# Patient Record
Sex: Female | Born: 1950 | Hispanic: Yes | Marital: Single | Smoking: Never smoker
Health system: Southern US, Community
[De-identification: ages and names within clinical notes are randomized; demographics above are authoritative.]

## PROBLEM LIST (undated history)

## (undated) DIAGNOSIS — I10 Essential (primary) hypertension: Secondary | ICD-10-CM

## (undated) DIAGNOSIS — M1990 Unspecified osteoarthritis, unspecified site: Secondary | ICD-10-CM

---

## 2018-12-28 ENCOUNTER — Encounter (HOSPITAL_COMMUNITY): Payer: Self-pay | Admitting: Emergency Medicine

## 2018-12-28 ENCOUNTER — Emergency Department (HOSPITAL_COMMUNITY)
Admission: EM | Admit: 2018-12-28 | Discharge: 2018-12-28 | Disposition: A | Discharge status: Home / Self Care | Payer: Self-pay | Attending: Emergency Medicine | Admitting: Emergency Medicine

## 2018-12-28 ENCOUNTER — Other Ambulatory Visit: Payer: Self-pay

## 2018-12-28 ENCOUNTER — Emergency Department (HOSPITAL_COMMUNITY): Payer: Self-pay

## 2018-12-28 DIAGNOSIS — R030 Elevated blood-pressure reading, without diagnosis of hypertension: Secondary | ICD-10-CM

## 2018-12-28 DIAGNOSIS — I10 Essential (primary) hypertension: Secondary | ICD-10-CM | POA: Insufficient documentation

## 2018-12-28 HISTORY — DX: Essential (primary) hypertension: I10

## 2018-12-28 LAB — BASIC METABOLIC PANEL
Anion gap: 12 (ref 5–15)
BUN: 14 mg/dL (ref 8–23)
CO2: 23 mmol/L (ref 22–32)
Calcium: 9.6 mg/dL (ref 8.9–10.3)
Chloride: 103 mmol/L (ref 98–111)
Creatinine, Ser: 0.84 mg/dL (ref 0.44–1.00)
GFR calc Af Amer: >60 mL/min (ref >60)
GFR calc non Af Amer: >60 mL/min (ref >60)
Glucose, Bld: 104 mg/dL — ABNORMAL HIGH (ref 70–99)
Potassium: 5 mmol/L (ref 3.5–5.1)
Sodium: 138 mmol/L (ref 135–145)

## 2018-12-28 LAB — CBC WITH DIFFERENTIAL/PLATELET
Abs Immature Granulocytes: 0.01 10*3/uL (ref 0.00–0.07)
Basophils Absolute: 0 10*3/uL (ref 0.0–0.1)
Basophils Relative: 0 %
Eosinophils Absolute: 0.1 10*3/uL (ref 0.0–0.5)
Eosinophils Relative: 2 %
HCT: 44.3 % (ref 36.0–46.0)
Hemoglobin: 14.7 g/dL (ref 12.0–15.0)
Immature Granulocytes: 0 %
Lymphocytes Relative: 45 %
Lymphs Abs: 2.8 10*3/uL (ref 0.7–4.0)
MCH: 28.5 pg (ref 26.0–34.0)
MCHC: 33.2 g/dL (ref 30.0–36.0)
MCV: 85.9 fL (ref 80.0–100.0)
Monocytes Absolute: 0.3 10*3/uL (ref 0.1–1.0)
Monocytes Relative: 5 %
Neutro Abs: 3 10*3/uL (ref 1.7–7.7)
Neutrophils Relative %: 48 %
Platelets: 271 10*3/uL (ref 150–400)
RBC: 5.16 MIL/uL — ABNORMAL HIGH (ref 3.87–5.11)
RDW: 13.8 % (ref 11.5–15.5)
WBC: 6.2 10*3/uL (ref 4.0–10.5)
nRBC: 0 % (ref 0.0–0.2)

## 2018-12-28 MED ORDER — IRBESARTAN 150 MG PO TABS
150.0000 mg | ORAL_TABLET | Freq: Once | ORAL | Status: AC
  Administered 2018-12-28: 13:00:00 150 mg via ORAL
  Filled 2018-12-28 (×2): qty 1

## 2018-12-28 MED ORDER — IRBESARTAN 150 MG PO TABS: 150.0000 mg | ORAL_TABLET | Freq: Every day | ORAL | 0 refills | Status: DC

## 2018-12-28 NOTE — ED Provider Notes (Signed)
[facility] Provider Note   CSN: [id number] Arrival date & time: 12/28/18  1058     History   Chief Complaint Chief Complaint  Patient presents with  . Hypertension    HPI Sherry Bailey is a [age] y.o. female with past medical history of hypertension, prediabetes presents to the emergency department today with chief complaint of high blood pressure. This started x 3 days ago after running out of her hypertension medication. She is here visiting from [place].  She was only planning to stay for short time however because of the pandemic she has been here for 5 months and unable to get a flight home.  She reports her blood pressure was well controlled while taking medication.  Since running out she has had multiple readings of systolics in the 200s.  She said she developed a headache 2 days ago.  Located in the left side of her head and mild.  She did not take any medications for the headache.  She went to urgent care today trying to get a refill on her blood pressure medication however because her systolic was again in the 200 she was sent to the emergency department for further evaluation.  She currently she is asymptomatic. She denies fever, chills, cough, chest pain, shortness of breath, headache, visual changes, neck pain, abdominal pain, nausea, vomiting, diaphoresis, back pain, urinary symptoms, syncope, dizziness, palpitations, lower extremity edema.  Past Medical History:  Diagnosis Date  . Hypertension     There are no active problems to display for this patient.    OB History   No obstetric history on file.      Home Medications    Prior to Admission medications   Medication Sig Start Date End Date Taking? Authorizing Provider  irbesartan (AVAPRO) 150 MG tablet Take 1 tablet (150 mg total) by mouth daily. 12/28/18 02/26/19  Albrizze, Caroleen HammanKaitlyn E, PA-C    Family History No family history on file.  Social History Social History   Tobacco Use  . Smoking status: Not on file  Substance Use Topics  . Alcohol use: Not on file  . Drug use: Not on file     Allergies   Aspirin   Review of Systems Review of Systems  Constitutional: Negative for chills and fever.  HENT: Negative for congestion, ear discharge, ear pain, sinus pressure, sinus pain and sore throat.   Eyes: Negative for pain and redness.  Respiratory: Negative for cough and shortness of breath.   Cardiovascular: Negative for chest pain, palpitations and leg swelling.  Gastrointestinal: Negative for abdominal pain, constipation, diarrhea, nausea and vomiting.  Genitourinary: Negative for dysuria and hematuria.  Musculoskeletal: Negative for back pain and neck pain.  Skin: Negative for wound.  Neurological: Positive for headaches. Negative for weakness and numbness.     Physical Exam Updated Vital Signs BP (!) 172/74   Pulse (!) 55   Temp 98.4 F (36.9 C) (Oral)   Resp 20   Ht 5\' 1"  (1.549 m)   Wt 81.2 kg   SpO2 96%   BMI 33.82 kg/m   Physical Exam Vitals signs and nursing note reviewed.  Constitutional:      General: She is not in acute distress.    Appearance: She is not ill-appearing.  HENT:     Head: Normocephalic and atraumatic.     Right Ear: Tympanic membrane and external ear normal.     Left Ear: Tympanic membrane and external ear normal.  Nose: Nose normal.     Mouth/Throat:     Mouth: Mucous membranes are moist.     Pharynx: Oropharynx is clear.  Eyes:     General: No scleral icterus.       Right eye: No discharge.        Left eye: No discharge.     Extraocular Movements: Extraocular movements intact.     Conjunctiva/sclera: Conjunctivae normal.     Pupils: Pupils are equal, round, and reactive to light.  Neck:     Musculoskeletal: Normal range of motion.     Vascular: No JVD.  Cardiovascular:     Rate and Rhythm: Normal rate and regular rhythm.     Pulses: Normal pulses.          Radial pulses are 2+ on the  right side and 2+ on the left side.     Heart sounds: Normal heart sounds.  Pulmonary:     Comments: Lungs clear to auscultation in all fields. Symmetric chest rise. No wheezing, rales, or rhonchi. Abdominal:     Comments: Abdomen is soft, non-distended, and non-tender in all quadrants. No rigidity, no guarding. No peritoneal signs.  Musculoskeletal: Normal range of motion.  Skin:    General: Skin is warm and dry.     Capillary Refill: Capillary refill takes less than 2 seconds.  Neurological:     Mental Status: She is oriented to person, place, and time.     GCS: GCS eye subscore is 4. GCS verbal subscore is 5. GCS motor subscore is 6.     Comments: Mental Status:  Alert, oriented, thought content appropriate, able to give a coherent history. Speech fluent without evidence of aphasia. Able to follow 2 step commands without difficulty.  Cranial Nerves:  II:  Peripheral visual fields grossly normal, pupils equal, round, reactive to light III,IV, VI: ptosis not present, extra-ocular motions intact bilaterally  V,VII: smile symmetric, facial light touch sensation equal VIII: hearing grossly normal to voice  X: uvula elevates symmetrically  XI: bilateral shoulder shrug symmetric and strong XII: midline tongue extension without fassiculations Motor:  Normal tone. 5/5 in upper and lower extremities bilaterally including strong and equal grip strength and dorsiflexion/plantar flexion Sensory: Pinprick and light touch normal in all extremities.  Deep Tendon Reflexes: 2+ and symmetric in the biceps and patella Cerebellar: normal finger-to-nose with bilateral upper extremities Gait: normal gait and balance CV: distal pulses palpable throughout   Psychiatric:        Behavior: Behavior normal.      ED Treatments / Results  Labs (all labs ordered are listed, but only abnormal results are displayed) Labs Reviewed  BASIC METABOLIC PANEL - Abnormal; Notable for the following components:       Result Value   Glucose, Bld 104 (*)    All other components within normal limits  CBC WITH DIFFERENTIAL/PLATELET - Abnormal; Notable for the following components:   RBC 5.16 (*)    All other components within normal limits    EKG EKG Interpretation  Date/Time:  Sunday December 28 2018 11:18:25 EDT Ventricular Rate:  73 PR Interval:    QRS Duration: 95 QT Interval:  414 QTC Calculation: 457 R Axis:   -59 Text Interpretation:  Sinus rhythm Left anterior fascicular block Abnormal R-wave progression, late transition Probable left ventricular hypertrophy Nonspecific T abnormalities, lateral leads No prior ECG for comparison.  No STEMI Confirmed by Theda Belfast ([id number]) on 12/28/2018 11:48:14 AM   Radiology Dg Chest 2 View  Result Date: 12/28/2018 CLINICAL DATA:  Hypertension. EXAM: CHEST - 2 VIEW COMPARISON:  None. FINDINGS: Borderline cardiomegaly. Normal mediastinal contours. Normal pulmonary vascularity. Minimal atelectasis in the right middle lobe. No focal consolidation, pleural effusion, or pneumothorax. No acute osseous abnormality. IMPRESSION: No active cardiopulmonary disease. Electronically Signed   By: Titus Dubin M.D.   On: 12/28/2018 12:54    Procedures Procedures (including critical care time)  Medications Ordered in ED Medications  irbesartan (AVAPRO) tablet 150 mg (150 mg Oral Given 12/28/18 1308)     Initial Impression / Assessment and Plan / ED Course  I have reviewed the triage vital signs and the nursing notes.  Pertinent labs & imaging results that were available during my care of the patient were reviewed by me and considered in my medical decision making (see chart for details).  Patient seen and evaluated.  On arrival she is hypertensive to 207/91. She is asymptomatic at this time. Her headache has resolved without intervention. Lungs are clear to auscultation in all fields, neuro exam without focal deficit. EKG without stemi, no prior to compare. CBC and  BMP are unremarkable. Chest xray viewed by me shows borderline cardiomegaly. Discussed xray findings with pt and she reports being aware of "having a big heart" and that her doctors at home are monitoring her. Engaged in shared decision making with pt regarding head CT as hypertension and headache are concerning for CVA. Pt declines CT at this time. Given her normal neuro exam I feel this is reasonable. Pt given home dose of irbesartan. Blood pressure improved.  The patient appears reasonably screened and/or stabilized for discharge and I doubt any other medical condition or other [facility] requiring further screening, evaluation, or treatment in the ED at this time prior to discharge. The patient is safe for discharge with strict return precautions discussed. Recommend pcp follow up. Pt given 2 month prescription for irbesartan as she is not sure when she will be able to fly home to [place]. Findings and plan of care discussed with supervising physician Dr. Sherry Ruffing.  This note was prepared using Dragon voice recognition software and may include unintentional dictation errors due to the inherent limitations of voice recognition software.    Final Clinical Impressions(s) / ED Diagnoses   Final diagnoses:  Elevated blood pressure reading    ED Discharge Orders         Ordered    irbesartan (AVAPRO) 150 MG tablet  Daily     12/28/18 1325           Flint Melter 12/28/18 2006    Tegeler, Gwenyth Allegra, MD 12/29/18 2011

## 2018-12-28 NOTE — ED Notes (Signed)
Irbesartan will be coming from main pharmacy, will admin when available

## 2018-12-28 NOTE — ED Notes (Signed)
Called pharmacy about avapro not available still, pharm will reprint label and send

## 2018-12-28 NOTE — Discharge Instructions (Addendum)
You have been seen today for high blood pressure. Please read and follow all provided instructions. Return to the emergency room for worsening condition or new concerning symptoms.     1. Medications:  Prescription printed for Irbesartan (avapro) 150mg . This is your blood pressure medication. Please take 1 pill every day. The prescription is for  2 months of pills. Continue usual home medications  2. Treatment: rest, drink plenty of fluids  3. Follow Up: Please follow up with your primary doctor if needed  ?

## 2018-12-28 NOTE — ED Triage Notes (Signed)
Pt ran out of medication for HTN. Yesterday, SBP 224mmHg; rechecked this AM, SBP 132mmHg. Has has L temporal HA x 2 days.  Denies CP, SOB, vision changes, heart palpatations.  Denies pacemaker, stroke hx. Has been prediabetic in past. Reports BP that she ran out of is irbesartan 150mg , prescribedonce daily in AM

## 2018-12-28 NOTE — ED Notes (Signed)
Patient transported to X-ray 

## 2019-05-12 ENCOUNTER — Emergency Department (HOSPITAL_COMMUNITY)
Admission: EM | Admit: 2019-05-12 | Discharge: 2019-05-12 | Disposition: A | Discharge status: Home / Self Care | Payer: Self-pay | Attending: Emergency Medicine | Admitting: Emergency Medicine

## 2019-05-12 ENCOUNTER — Encounter (HOSPITAL_COMMUNITY): Payer: Self-pay | Admitting: Emergency Medicine

## 2019-05-12 ENCOUNTER — Other Ambulatory Visit: Payer: Self-pay

## 2019-05-12 DIAGNOSIS — Z760 Encounter for issue of repeat prescription: Secondary | ICD-10-CM | POA: Insufficient documentation

## 2019-05-12 DIAGNOSIS — I10 Essential (primary) hypertension: Secondary | ICD-10-CM | POA: Insufficient documentation

## 2019-05-12 MED ORDER — IRBESARTAN 150 MG PO TABS: 150.0000 mg | ORAL_TABLET | Freq: Every day | ORAL | 0 refills | Status: AC

## 2019-05-12 NOTE — Care Management (Signed)
ED CM noted patient to be without insurance discussed Goodrx, and discount for the uninsured. CM provided patient with instructions and  Goodrx card.  No further ED CM needs identified.

## 2019-05-12 NOTE — ED Triage Notes (Signed)
Patient ran out of her antihypertensive medication yesterday , requesting refill/prescription . Denies pain /respirations unlabored .

## 2019-05-12 NOTE — ED Provider Notes (Signed)
[facility] EMERGENCY DEPARTMENT Provider Note   CSN: [id number] Arrival date & time: 05/12/19  1919     History Chief Complaint  Patient presents with  . Medication Refill    Sherry Bailey is a 69 y.o. female.  Patient presents to the emergency department with a chief complaint of needing her blood pressure medicine refilled.  She states she does not have a doctor because she is from [place].  She is going back there in the next 2 to 3 months.  She is requesting a refill of Irbesartan.  She denies having any symptoms.  She states that she had nowhere else to go to get the medication refilled, so she came to the ER.  The history is provided by the patient. A language interpreter was used ([place]).       Past Medical History:  Diagnosis Date  . Hypertension     There are no problems to display for this patient.   History reviewed. No pertinent surgical history.   OB History   No obstetric history on file.     No family history on file.  Social History   Tobacco Use  . Smoking status: Never Smoker  . Smokeless tobacco: Never Used  Substance Use Topics  . Alcohol use: Never  . Drug use: Never    Home Medications Prior to Admission medications   Medication Sig Start Date End Date Taking? Authorizing Provider  irbesartan (AVAPRO) 150 MG tablet Take 1 tablet (150 mg total) by mouth daily. 05/12/19 08/10/19  Montine Circle, PA-C    Allergies    Aspirin  Review of Systems   Review of Systems  All other systems reviewed and are negative.   Physical Exam Updated Vital Signs BP (!) 193/98 (BP Location: Right Arm)   Pulse 79   Temp 98.8 F (37.1 C) (Oral)   Resp 17   SpO2 98%   Physical Exam Vitals and nursing note reviewed.  Constitutional:      General: She is not in acute distress.    Appearance: She is well-developed.  HENT:     Head: Normocephalic and atraumatic.  Eyes:     Conjunctiva/sclera: Conjunctivae normal.    Cardiovascular:     Rate and Rhythm: Normal rate.     Heart sounds: No murmur.  Pulmonary:     Effort: Pulmonary effort is normal. No respiratory distress.  Abdominal:     General: There is no distension.  Musculoskeletal:     Cervical back: Neck supple.     Comments: Moves all extremities  Skin:    General: Skin is warm and dry.  Neurological:     Mental Status: She is alert and oriented to person, place, and time.  Psychiatric:        Mood and Affect: Mood normal.        Behavior: Behavior normal.     ED Results / Procedures / Treatments   Labs (all labs ordered are listed, but only abnormal results are displayed) Labs Reviewed - No data to display  EKG None  Radiology No results found.  Procedures Procedures (including critical care time)  Medications Ordered in ED Medications - No data to display  ED Course  I have reviewed the triage vital signs and the nursing notes.  Pertinent labs & imaging results that were available during my care of the patient were reviewed by me and considered in my medical decision making (see chart for details).    MDM Rules/Calculators/A&P  Blood pressure medication refill.  Asymptomatic.  No other complaints.   Final Clinical Impression(s) / ED Diagnoses Final diagnoses:  Medication refill    Rx / DC Orders ED Discharge Orders         Ordered    irbesartan (AVAPRO) 150 MG tablet  Daily     05/12/19 2216           Roxy Horseman, PA-C 05/12/19 2218    Tegeler, Canary Brim, MD 05/12/19 2352

## 2019-06-14 ENCOUNTER — Other Ambulatory Visit: Payer: Self-pay

## 2019-06-14 ENCOUNTER — Emergency Department (HOSPITAL_COMMUNITY): Payer: Self-pay

## 2019-06-14 ENCOUNTER — Emergency Department (HOSPITAL_COMMUNITY)
Admission: EM | Admit: 2019-06-14 | Discharge: 2019-06-14 | Disposition: A | Discharge status: Home / Self Care | Payer: Self-pay | Attending: Emergency Medicine | Admitting: Emergency Medicine

## 2019-06-14 ENCOUNTER — Encounter (HOSPITAL_COMMUNITY): Payer: Self-pay | Admitting: Emergency Medicine

## 2019-06-14 DIAGNOSIS — M25551 Pain in right hip: Secondary | ICD-10-CM | POA: Insufficient documentation

## 2019-06-14 DIAGNOSIS — W010XXD Fall on same level from slipping, tripping and stumbling without subsequent striking against object, subsequent encounter: Secondary | ICD-10-CM | POA: Insufficient documentation

## 2019-06-14 DIAGNOSIS — I10 Essential (primary) hypertension: Secondary | ICD-10-CM | POA: Insufficient documentation

## 2019-06-14 DIAGNOSIS — M5441 Lumbago with sciatica, right side: Secondary | ICD-10-CM | POA: Insufficient documentation

## 2019-06-14 DIAGNOSIS — M25552 Pain in left hip: Secondary | ICD-10-CM | POA: Insufficient documentation

## 2019-06-14 DIAGNOSIS — S92355A Nondisplaced fracture of fifth metatarsal bone, left foot, initial encounter for closed fracture: Secondary | ICD-10-CM

## 2019-06-14 DIAGNOSIS — S92355D Nondisplaced fracture of fifth metatarsal bone, left foot, subsequent encounter for fracture with routine healing: Secondary | ICD-10-CM | POA: Insufficient documentation

## 2019-06-14 DIAGNOSIS — Z79899 Other long term (current) drug therapy: Secondary | ICD-10-CM | POA: Insufficient documentation

## 2019-06-14 DIAGNOSIS — M25559 Pain in unspecified hip: Secondary | ICD-10-CM

## 2019-06-14 HISTORY — DX: Unspecified osteoarthritis, unspecified site: M19.90

## 2019-06-14 MED ORDER — HYDROCODONE-ACETAMINOPHEN 5-325 MG PO TABS
1.0000 | ORAL_TABLET | Freq: Once | ORAL | Status: AC
  Administered 2019-06-14: 1 via ORAL
  Filled 2019-06-14: qty 1

## 2019-06-14 MED ORDER — TIZANIDINE HCL 2 MG PO CAPS: 2.0000 mg | ORAL_CAPSULE | Freq: Three times a day (TID) | ORAL | 0 refills | Status: AC

## 2019-06-14 MED ORDER — PREDNISONE 20 MG PO TABS: 40.0000 mg | ORAL_TABLET | Freq: Every day | ORAL | 0 refills | Status: AC

## 2019-06-14 NOTE — ED Notes (Signed)
Pt walked independently to BR.

## 2019-06-14 NOTE — ED Provider Notes (Signed)
[facility] EMERGENCY DEPARTMENT Provider Note   CSN: [id number] Arrival date & time: 06/14/19  1231     History Chief Complaint  Patient presents with  . Back Pain  . Ankle Pain  . Fall    Sherry Bailey is a 69 y.o. female who presents for evaluation of consistent back pain, ankle pain and bilateral hip pain that has been ongoing for last 2 months.  She reports that a 2 months ago, she slipped and twisted her left ankle.  She also reports that when she did this, she twisted her entire body.  She states that she was having some pain and swelling around the left ankle and states that she was using ice and ibuprofen out with symptoms.  About a week later, she started having some lower back pain and pain in her bilateral hips.  She reports that since that incident she is continued to have persistent pain in her back, hips.  She feels like at times, the area will get swollen and inflamed and she has to do ibuprofen and ice to help with the inflammation.  She has not had any new trauma, injury, fall.  She has not sought evaluation for this.  She reports that today was the first time.  No changes or worsening symptoms but seems to states that she continues to have issues and so she wanted to get it checked out.  Patient is concerned that she may have arthritis but has never been diagnosed with arthritis.  She has not taken any other medications.  She has been able to ambulate but does sometimes report worsening pain in her hips with ambulation.  Patient denies any fevers, numbness/weakness of arms or legs, difficulty ambulating, history of back surgery, urinary or bowel incontinence, saddle anesthesia.  The history is provided by the patient.       Past Medical History:  Diagnosis Date  . Arthritis   . Hypertension     There are no problems to display for this patient.   History reviewed. No pertinent surgical history.   OB History   No obstetric history on file.      No family history on file.  Social History   Tobacco Use  . Smoking status: Never Smoker  . Smokeless tobacco: Never Used  Substance Use Topics  . Alcohol use: Never  . Drug use: Never    Home Medications Prior to Admission medications   Medication Sig Start Date End Date Taking? Authorizing Provider  irbesartan (AVAPRO) 150 MG tablet Take 1 tablet (150 mg total) by mouth daily. 05/12/19 08/10/19  Roxy Horseman, PA-C  predniSONE (DELTASONE) 20 MG tablet Take 2 tablets (40 mg total) by mouth daily for 4 days. 06/14/19 06/18/19  Maxwell Caul, PA-C  tizanidine (ZANAFLEX) 2 MG capsule Take 1 capsule (2 mg total) by mouth 3 (three) times daily for 5 days. 06/14/19 06/19/19  Maxwell Caul, PA-C    Allergies    Aspirin  Review of Systems   Review of Systems  Constitutional: Negative for fever.  Musculoskeletal: Positive for back pain.       Left ankle pain Hip pain  Neurological: Negative for weakness and numbness.  All other systems reviewed and are negative.   Physical Exam Updated Vital Signs BP 135/88   Pulse 84   Temp 98.6 F (37 C) (Oral)   Resp 18   SpO2 98%   Physical Exam Vitals and nursing note reviewed.  Constitutional:  Appearance: She is well-developed.  HENT:     Head: Normocephalic and atraumatic.  Eyes:     General: No scleral icterus.       Right eye: No discharge.        Left eye: No discharge.     Conjunctiva/sclera: Conjunctivae normal.  Cardiovascular:     Pulses:          Dorsalis pedis pulses are 2+ on the right side and 2+ on the left side.  Pulmonary:     Effort: Pulmonary effort is normal.  Musculoskeletal:       Back:     Comments: No midline T-spine tenderness.  No deformity or crepitus noted.  Diffuse tenderness noted to the lower lumbar region that extends over the entire lower lumbar area including the midline.  No midline deformity or crepitus noted.  Mild tenderness palpation noted bilateral hips.  Full range of  hips without any difficulty.  No deformity or crepitus noted bilateral hips.  No bony tenderness noted to bilateral knees, bilateral tib-fib, right ankle.  Mild tenderness noted to lateral malleolus of left ankle.  No overlying soft tissue swelling.  Dorsiflexion plantarflexion intact without difficulty.  Skin:    General: Skin is warm and dry.     Comments: Good distal cap refill. LLE is not dusky in appearance or cool to touch.  Neurological:     Mental Status: She is alert.     Comments: Follows commands, Moves all extremities  5/5 strength to BUE and BLE  Sensation intact throughout all major nerve distributions Straight leg raise noted on the right lower extremity.  Psychiatric:        Speech: Speech normal.        Behavior: Behavior normal.     ED Results / Procedures / Treatments   Labs (all labs ordered are listed, but only abnormal results are displayed) Labs Reviewed - No data to display  EKG None  Radiology DG Lumbar Spine Complete  Result Date: 06/14/2019 CLINICAL DATA:  Status post fall 2 months ago with low back pain. EXAM: LUMBAR SPINE - COMPLETE 4+ VIEW COMPARISON:  None. FINDINGS: There is no acute fracture or dislocation. The alignment is normal. Degenerative joint changes with narrowed joint space and osteophyte formation are identified in the thoracolumbar junction and in the lower lumbar spine. IMPRESSION: No acute fracture or dislocation. Degenerative joint changes of spine. Electronically Signed   By: Abelardo Diesel M.D.   On: 06/14/2019 14:42   DG Ankle Complete Left  Result Date: 06/14/2019 CLINICAL DATA:  Status post fall 2 months ago with left ankle pain. EXAM: LEFT ANKLE COMPLETE - 3+ VIEW COMPARISON:  None. FINDINGS: There is deformity of the base of the fifth metatarsal with evidence of callus formation consistent with healing fracture. Plantar calcaneal spur is noted. Soft tissues are unremarkable. IMPRESSION: There is deformity of the base of the fifth  metatarsal with evidence of callus formation consistent with healing fracture. Electronically Signed   By: Abelardo Diesel M.D.   On: 06/14/2019 14:45   DG Hips Bilat W or Wo Pelvis 3-4 Views  Result Date: 06/14/2019 CLINICAL DATA:  Status post fall 2 months ago with hip pain. EXAM: DG HIP (WITH OR WITHOUT PELVIS) 3-4V BILAT COMPARISON:  None. FINDINGS: There is no evidence of hip fracture or dislocation. There is no evidence of arthropathy or other focal bone abnormality. IMPRESSION: Negative. Electronically Signed   By: Abelardo Diesel M.D.   On: 06/14/2019 14:43  Procedures Procedures (including critical care time)  Medications Ordered in ED Medications  HYDROcodone-acetaminophen (NORCO/VICODIN) 5-325 MG per tablet 1 tablet (1 tablet Oral Given 06/14/19 1348)    ED Course  I have reviewed the triage vital signs and the nursing notes.  Pertinent labs & imaging results that were available during my care of the patient were reviewed by me and considered in my medical decision making (see chart for details).    MDM Rules/Calculators/A&P                      69 year old female who presents for evaluation of persistent continued lower back pain, bilateral hip pain and left ankle pain.  She reports that symptoms initially began after she twisted about 2 months ago.  But she states that since then, she has continued to have pain.  She has been taking ibuprofen with minimal improvement.  No new trauma, injury.  No new changes in symptoms but just felt like this is been ongoing for so long that she need to get checked out.  Initially arrival, she is afebrile, nontoxic-appearing.  Vital signs are stable.  She is neurovascular intact.  No red flag symptoms.  No neuro deficits noted.  History/physical exam not concerning for cauda equina, spinal abscess.  History/physical exam not concerning for ischemic limb.  I suspect this is mostly musculoskeletal in nature versus arthritis.  I do not suspect anything  acutely broken given lack of trauma, injury.  We will plan for x-rays, analgesics.  Lumbar x-ray shows no acute bony abnormality.  Hip x-ray negative.  Ankle x-ray shows no acute bony abnormality.  There is a deformity at the base of the fifth metatarsal with evidence of callus formation consistent with healing fracture.  This may correlate to when she had a previous injury.  At this time, patient is able to ambulate without any difficulty.  She has been Press photographer in ED.  She is hemodynamically stable.  I suspect this may be musculoskeletal versus early arthritis.  I used the interpreter to discuss results with patient.  Encouraged at home supportive care measures.  We will give her outpatient orthopedic.  Patient given a short course of prednisone to help with sciatic type pain as well as a short course of Zanaflex. At this time, patient exhibits no emergent life-threatening condition that require further evaluation in ED or admission. Patient had ample opportunity for questions and discussion. All patient's questions were answered with full understanding. Strict return precautions discussed. Patient expresses understanding and agreement to plan.   Portions of this note were generated with Scientist, clinical (histocompatibility and immunogenetics). Dictation errors may occur despite best attempts at proofreading.   Final Clinical Impression(s) / ED Diagnoses Final diagnoses:  Nondisplaced fracture of fifth metatarsal bone, left foot, initial encounter for closed fracture  Acute bilateral low back pain with right-sided sciatica  Hip pain    Rx / DC Orders ED Discharge Orders         Ordered    tizanidine (ZANAFLEX) 2 MG capsule  3 times daily     06/14/19 1604    predniSONE (DELTASONE) 20 MG tablet  Daily     06/14/19 1604           Rosana Hoes 06/14/19 2023    Little, Ambrose Finland, MD 06/14/19 2026

## 2019-06-14 NOTE — ED Notes (Signed)
Pt ambulated to the restroom.

## 2019-06-14 NOTE — ED Triage Notes (Signed)
Pt states she twisted L ankle and fell 2 months ago.  C/o pain to L ankle, L foot, bilateral hips, and lower back.  [employer] interpreter used for triage.

## 2019-06-14 NOTE — Discharge Instructions (Addendum)
As we discussed, your x-ray showed an old fracture of your fifth foot bone.  This is likely from when you injured it several months ago.  It is healing well and does not need any acute intervention.  As we discussed, your x-rays of your hip and back were reassuring.  Take medications as directed. Zanaflex can make you slightly drowsy so you should use it in caution.   You can take 1000 mg of Tylenol.  Do not exceed 4000 mg of Tylenol a day.  Additionally, you can do heat and stretches to help with pain.  As we discussed, for your foot, you will need to wear supportive shoes to prevent any further injury.  I provided orthopedic referral if your symptoms do not improve in the next several weeks, you can follow-up with them.  Return the emergency department for any worsening pain, difficulty walking, weakness, fevers.   As we discussed, your blood pressure was slightly elevated here in the emergency department today.  This needs to be followed up with a primary care doctor to establish if you need medication.

## 2019-06-14 NOTE — ED Notes (Signed)
Patient transported to X-ray 

## 2020-07-04 IMAGING — CR DG HIP (WITH OR WITHOUT PELVIS) 3-4V BILAT
5 series · 5 of 5 positions shown · non-contrast
Comparison: None.

CLINICAL DATA: Status post fall 2 months ago with hip pain.

EXAM:
DG HIP (WITH OR WITHOUT PELVIS) 3-4V BILAT

[pelvis ap]
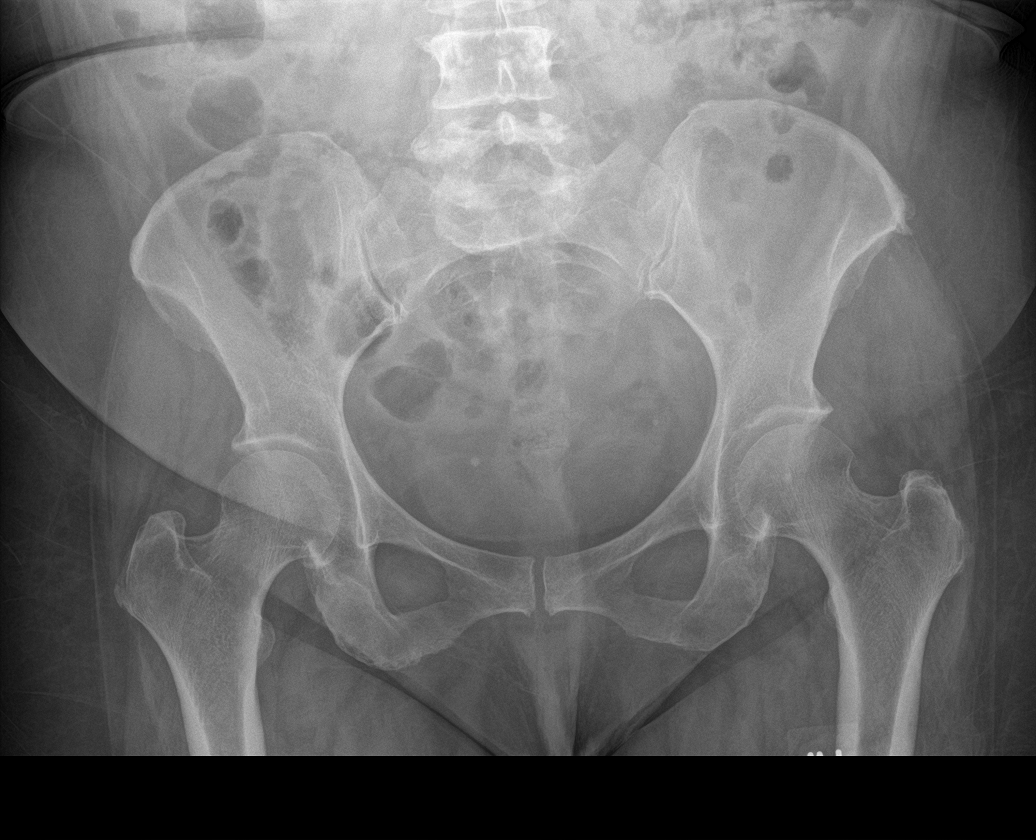

[hip ap (1 of 2)]
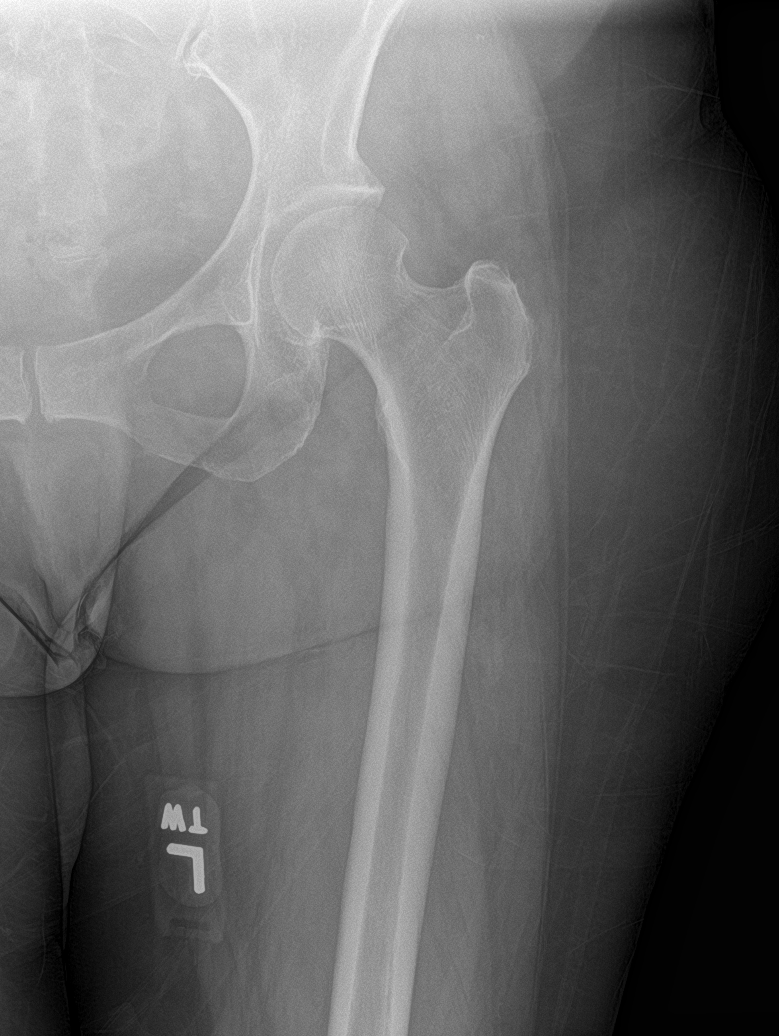

[hip lat (1 of 2)]
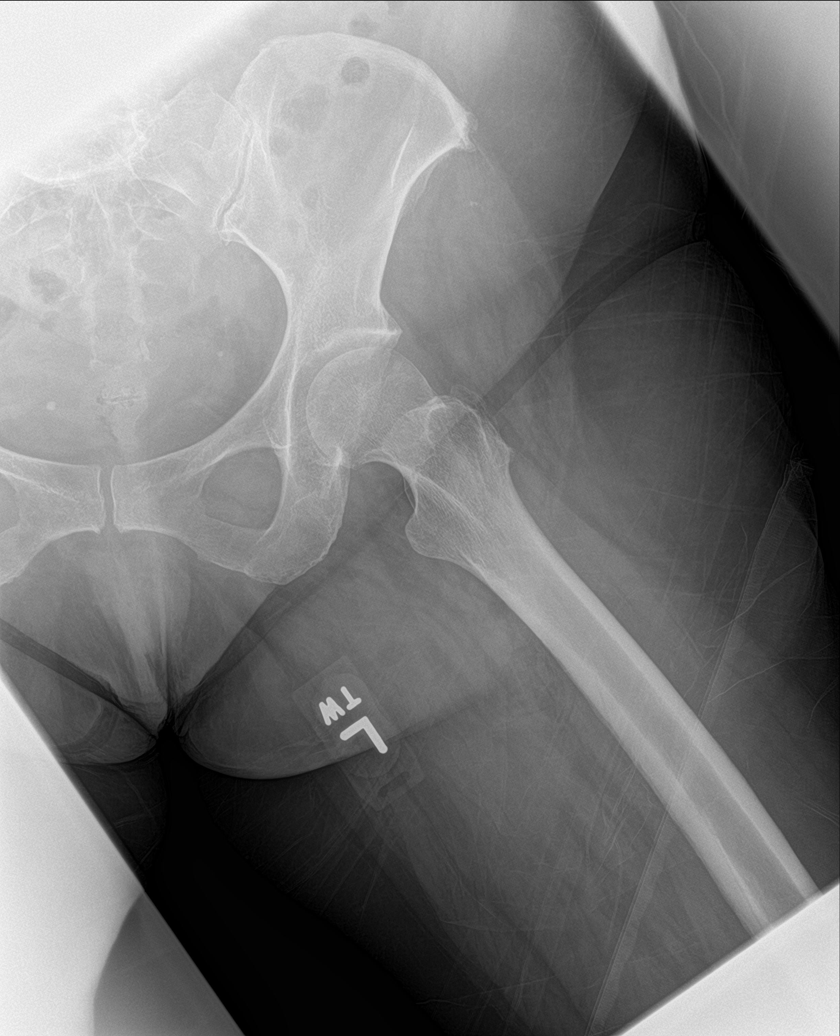

[hip ap (2 of 2)]
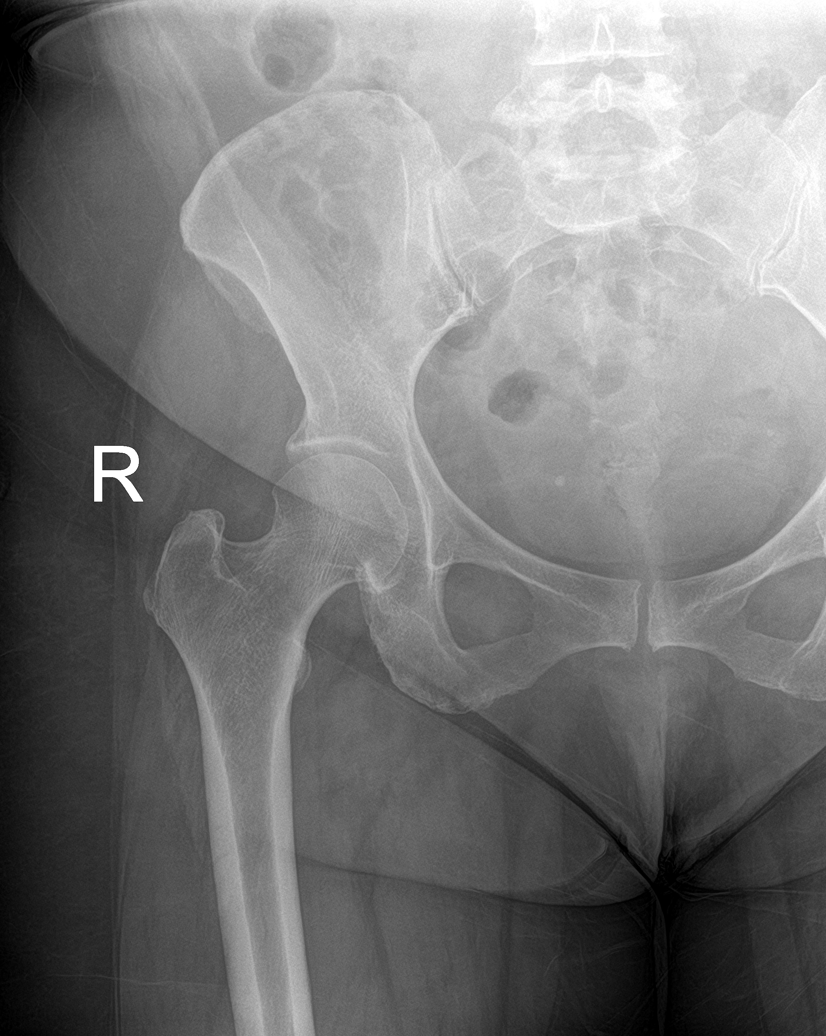

[hip lat (2 of 2)]
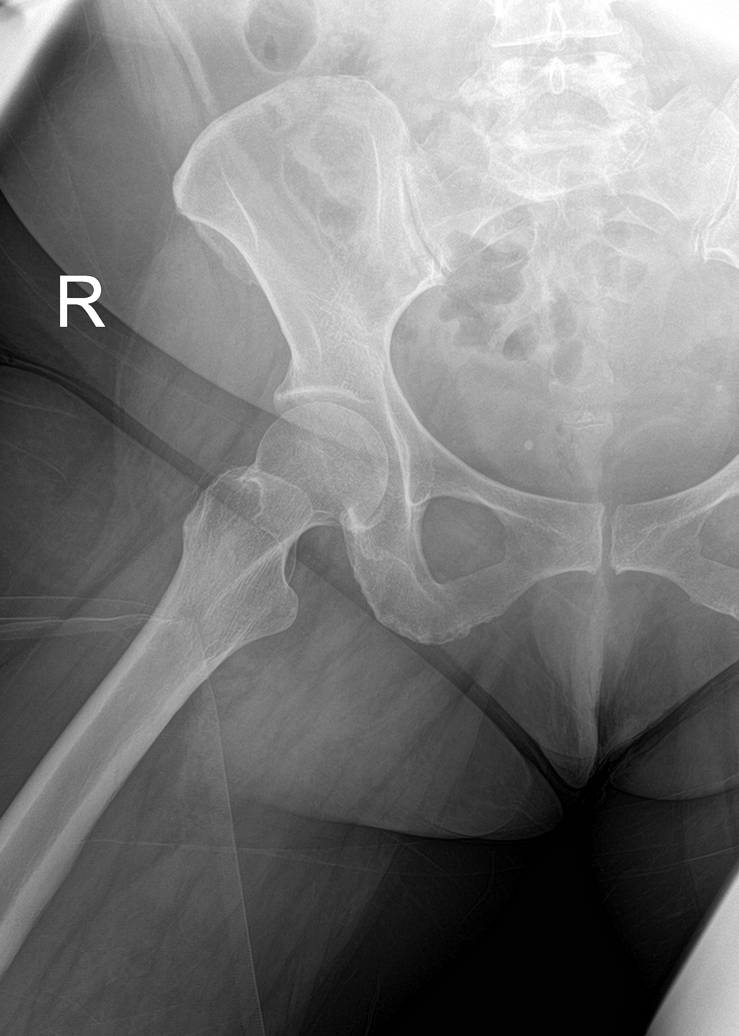

[5 of 5 positions shown; findings below may reference images not displayed]

FINDINGS: There is no evidence of hip fracture or dislocation. There is no
evidence of arthropathy or other focal bone abnormality.
IMPRESSION: Negative.
